# Patient Record
Sex: Male | Born: 1970 | Race: White | Hispanic: No | Marital: Married | Smoking: Never smoker
Health system: Southern US, Community
[De-identification: ages and names within clinical notes are randomized; demographics above are authoritative.]

## PROBLEM LIST (undated history)

## (undated) DIAGNOSIS — G4710 Hypersomnia, unspecified: Secondary | ICD-10-CM

## (undated) DIAGNOSIS — I10 Essential (primary) hypertension: Secondary | ICD-10-CM

## (undated) DIAGNOSIS — G4733 Obstructive sleep apnea (adult) (pediatric): Secondary | ICD-10-CM

## (undated) HISTORY — PX: TONSILLECTOMY: SUR1361

## (undated) HISTORY — DX: Obstructive sleep apnea (adult) (pediatric): G47.33

## (undated) HISTORY — DX: Hypersomnia, unspecified: G47.10

## (undated) HISTORY — PX: KNEE SURGERY: SHX244

## (undated) HISTORY — PX: VASECTOMY: SHX75

## (undated) HISTORY — PX: NASAL SEPTUM SURGERY: SHX37

---

## 2000-07-11 ENCOUNTER — Encounter: Payer: Self-pay | Admitting: Pulmonary Disease

## 2003-09-29 ENCOUNTER — Encounter: Payer: Self-pay | Admitting: Pulmonary Disease

## 2003-09-29 ENCOUNTER — Ambulatory Visit (HOSPITAL_BASED_OUTPATIENT_CLINIC_OR_DEPARTMENT_OTHER): Admission: RE | Admit: 2003-09-29 | Discharge: 2003-09-29 | Payer: Self-pay | Admitting: Pulmonary Disease

## 2003-09-30 ENCOUNTER — Encounter: Payer: Self-pay | Admitting: Pulmonary Disease

## 2004-09-16 ENCOUNTER — Ambulatory Visit: Payer: Self-pay | Admitting: Pulmonary Disease

## 2005-08-26 ENCOUNTER — Ambulatory Visit: Payer: Self-pay | Admitting: Pulmonary Disease

## 2005-11-01 ENCOUNTER — Ambulatory Visit: Payer: Self-pay | Admitting: Pulmonary Disease

## 2005-11-16 ENCOUNTER — Encounter: Payer: Self-pay | Admitting: Pulmonary Disease

## 2005-11-16 ENCOUNTER — Ambulatory Visit: Payer: Self-pay | Admitting: Internal Medicine

## 2006-04-20 ENCOUNTER — Ambulatory Visit: Payer: Self-pay | Admitting: Pulmonary Disease

## 2006-12-21 ENCOUNTER — Ambulatory Visit: Payer: Self-pay | Admitting: Pulmonary Disease

## 2007-11-27 ENCOUNTER — Emergency Department (HOSPITAL_COMMUNITY): Admission: EM | Admit: 2007-11-27 | Discharge: 2007-11-27 | Payer: Self-pay | Admitting: Family Medicine

## 2008-10-28 ENCOUNTER — Emergency Department (HOSPITAL_COMMUNITY): Admission: EM | Admit: 2008-10-28 | Discharge: 2008-10-28 | Payer: Self-pay | Admitting: Emergency Medicine

## 2010-02-17 DIAGNOSIS — G4733 Obstructive sleep apnea (adult) (pediatric): Secondary | ICD-10-CM | POA: Insufficient documentation

## 2010-02-18 ENCOUNTER — Ambulatory Visit: Payer: Self-pay | Admitting: Pulmonary Disease

## 2010-02-18 DIAGNOSIS — J309 Allergic rhinitis, unspecified: Secondary | ICD-10-CM | POA: Insufficient documentation

## 2010-02-18 DIAGNOSIS — G4710 Hypersomnia, unspecified: Secondary | ICD-10-CM

## 2010-02-19 ENCOUNTER — Telehealth (INDEPENDENT_AMBULATORY_CARE_PROVIDER_SITE_OTHER): Payer: Self-pay | Admitting: *Deleted

## 2010-03-09 ENCOUNTER — Encounter: Payer: Self-pay | Admitting: Pulmonary Disease

## 2010-03-17 ENCOUNTER — Emergency Department (HOSPITAL_COMMUNITY): Admission: EM | Admit: 2010-03-17 | Discharge: 2010-03-17 | Payer: Self-pay | Admitting: Family Medicine

## 2010-03-18 ENCOUNTER — Ambulatory Visit: Payer: Self-pay | Admitting: Pulmonary Disease

## 2010-11-02 NOTE — Progress Notes (Signed)
Summary: Physician's handwritten notes/[facility]  Physician's handwritten notes/[facility]   Imported By: Sherian Rein 02/19/2010 07:46:33  _____________________________________________________________________  External Attachment:    Type:   Image     Comment:   External Document

## 2010-11-02 NOTE — Assessment & Plan Note (Signed)
Summary: acute sick visit for osa/hypersomnia   Copy to:  Self Referral Primary Provider/Referring Provider:  Deboraha Sprang at [facility]  CC:  Sleep consult.  Former pt.  Last seen March 2008.Marland Kitchen  History of Present Illness: the pt is a 40y/o male who comes in today for f/u of his known osa and daytime sleepiness.  He has known mild osa/UARS, and MSLT in the past was negative for narcolepsy with no SOREMS and MSL .  He has been treated successfully in the past with cpap, and qam provigil.  He has not been seen since 2008, and comes in with significant sleepiness, but he has not been using cpap or taking his stimulant medication.  He has been falling asleep at work, and has severe daytime sleepiness.  His weight is fairly neutral since the last visit.  Current Medications (verified): 1)  Provigil 100 Mg Tabs (Modafinil) .... Use As Needed  Allergies (verified): 1)  ! Codeine  Past History:  Past Medical History:  ALLERGIC RHINITIS (ICD-477.9) OBSTRUCTIVE SLEEP APNEA (ICD-327.23)--AHI 5/hr with [facility] noted 2004 Hypersomnia--mslt 2004 with no SOREMS, MSL .    Past Surgical History: deviated septum tonsillectomy as a child vasectomy L knee surgery   Family History: Reviewed history from 02/17/2010 and no changes required. heart disease: maternal grandfather (m.i.)  Social History: Reviewed history from 02/17/2010 and no changes required. Patient never smoked.  pt is a Garment/textile technologist. pt is married. pt has children.  Review of Systems       The patient complains of nasal congestion/difficulty breathing through nose.  The patient denies shortness of breath with activity, shortness of breath at rest, productive cough, non-productive cough, coughing up blood, chest pain, irregular heartbeats, acid heartburn, indigestion, loss of appetite, weight change, abdominal pain, difficulty swallowing, sore throat, tooth/dental problems, headaches, sneezing, itching, ear ache, anxiety,  depression, hand/feet swelling, joint stiffness or pain, rash, change in color of mucus, and fever.    Vital Signs:  Patient profile:   40 year old male Height:      66 inches Weight:      177 pounds BMI:     28.67 O2 Sat:      98 % on Room air Temp:     98.0 degrees F oral Pulse rate:   84 / minute BP sitting:   128 / 86  (left arm) Cuff size:   regular  Vitals Entered By: Arman Filter LPN (Feb 18, 2010 3:03 PM)  O2 Flow:  Room air CC: Sleep consult.  Former pt.  Last seen March 2008. Comments Medications reviewed with patient Arman Filter LPN  Feb 18, 2010 3:03 PM    Physical Exam  General:  thin male in nad Neurologic:  appears very sleepy, but appropriate,moves all 4.   Impression & Recommendations:  Problem # 1:  OBSTRUCTIVE SLEEP APNEA (ICD-327.23) the pt has very mild osa by prior sleep study, and also clear cut upper airway resistance.  He has benefitted from cpap in the past, and should get back on this treatment.  I have also discussed with him the possibility of upper airway surgery and dental appliance.  Problem # 2:  HYPERSOMNIA (ICD-780.54) the pt has had persistent daytime hypersomnia despite cpap treatment in the past.  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  He has responded well to the addtion of stimulant medication in the form of provigil, and will restart him on nuvigil at this time.  Time spent  with pt today was .  Medications Added to Medication List This Visit: 1)  Provigil 100 Mg Tabs (Modafinil) .... Use as needed 2)  Nuvigil 150 Mg Tabs (Armodafinil) .... One each am 3)  Nuvigil 150 Mg Tabs (Armodafinil) .... One each am  Other Orders: Est. Patient Level IV (16109) DME Referral (DME)  Patient Instructions: 1)  will start back on cpap at same pressure level 2)  get back on nuvigil 150mg  one each day. 3)  followup with me in 4 weeks.  Prescriptions: NUVIGIL 150 MG TABS (ARMODAFINIL) one each am  #30 x  5   Entered and Authorized by:   Barbaraann Share MD   Signed by:   Barbaraann Share MD on 02/18/2010   Method used:   Print then Give to Patient   RxID:   [id number] NUVIGIL 150 MG TABS (ARMODAFINIL) one each am  #30 x 0   Entered and Authorized by:   Barbaraann Share MD   Signed by:   Barbaraann Share MD on 02/18/2010   Method used:   Print then Give to Patient   RxID:   [id number]

## 2010-11-02 NOTE — Consult Note (Signed)
Summary: Sleep Consult/[facility]  Sleep Consult/[facility]   Imported By: Sherian Rein 02/19/2010 07:44:50  _____________________________________________________________________  External Attachment:    Type:   Image     Comment:   External Document

## 2010-11-02 NOTE — Medication Information (Signed)
Summary: Tax adviser   Imported By: Lehman Prom 03/09/2010 09:12:35  _____________________________________________________________________  External Attachment:    Type:   Image     Comment:   External Document

## 2010-11-02 NOTE — Progress Notes (Signed)
Summary: Jose Singh  Phone Note Outgoing Call   Call placed by: Gweneth Dimitri RN,  Feb 19, 2010 3:38 PM Summary of Call: Received Prior Auth for Nuvigil 150mg .  Called number provided ([employer]) to initiate PA.  Was told to call [employer] at [phone] to initiate it.  Awaiting form to be faxed to triage.   Initial call taken by: Gweneth Dimitri RN,  Feb 19, 2010 3:39 PM  Follow-up for Phone Call        Form received and placed in [facility] very important folder to be addressed.  Gweneth Dimitri RN  Feb 19, 2010 3:43 PM  Form was faxed back to [facility] and placed in traige awaiting approval/denial Jose Singh  Feb 23, 2010 3:05 PM   Additional Follow-up for Phone Call Additional follow up Details #1::        received results from [facility].  Pt's Jose Singh has been approved effective 02-23-2010 to 11-18-2012.  Reference # is [id number].    Jose Millet Reynolds LPN  March 04, 2955 8:54 AM

## 2010-11-02 NOTE — Progress Notes (Signed)
Summary: Physician's handwritten notes/[facility]  Physician's handwritten notes/[facility]   Imported By: Sherian Rein 02/19/2010 07:48:19  _____________________________________________________________________  External Attachment:    Type:   Image     Comment:   External Document

## 2010-11-02 NOTE — Assessment & Plan Note (Signed)
Summary: rov for osa, EDS   Copy to:  Self Referral Primary Provider/Referring Provider:  Deboraha Sprang at [facility]  CC:  Pt is here for a 4 week f/u appt.  Pt states he is wearing his cpap machine "almost every night" Approx 5 nights per week and 7 hours per night.  Pt denied any complaints with mask or pressure.  Pt states Nuvigil is helping with "sx."  pt denied any new complaints. .  History of Present Illness: the pt comes in today for f/u of his mild osa with severe daytime sleepiness.  He was restarted on cpap the last visit, and also nuvigil during the day in order to maintain alertness at work.  He is doing well with cpap, and reports no problems with mask fit or pressure.  He wears during the week, but takes "holidays" on w/e since his degree of sleep apnea is not a health risk for him.  The nuvigil is working well for him while at work, and again, he typically takes drug holiday on weekends.  He is also working on behavioral changes to help with alertness during day.  Current Medications (verified): 1)  Nuvigil 150 Mg Tabs (Armodafinil) .... One Each Am 2)  Aspirin Ec 81 Mg Tbec (Aspirin) .... Take 1 Tablet By Mouth Once A Day 3)  Multivitamins  Tabs (Multiple Vitamin) .... Take 1 Tablet By Mouth Once A Day 4)  Fish Oil Tabs  Allergies (verified): 1)  ! Codeine  Review of Systems  The patient denies shortness of breath with activity, shortness of breath at rest, productive cough, non-productive cough, coughing up blood, chest pain, irregular heartbeats, acid heartburn, indigestion, loss of appetite, weight change, abdominal pain, difficulty swallowing, sore throat, tooth/dental problems, headaches, nasal congestion/difficulty breathing through nose, sneezing, itching, ear ache, anxiety, depression, hand/feet swelling, joint stiffness or pain, rash, change in color of mucus, and fever.    Vital Signs:  Patient profile:   40 year old male Height:      66 inches Weight:      179  pounds O2 Sat:      96 % on Room air Temp:     98.0 degrees F oral Pulse rate:   61 / minute BP sitting:   112 / 70  (left arm) Cuff size:   regular  Vitals Entered By: Arman Filter LPN (March 18, 2010 3:54 PM)  O2 Flow:  Room air CC: Pt is here for a 4 week f/u appt.  Pt states he is wearing his cpap machine "almost every night" Approx 5 nights per week and 7 hours per night.  Pt denied any complaints with mask or pressure.  Pt states Nuvigil is helping with "sx."  pt denied any new complaints.  Comments Medications reviewed with patient Arman Filter LPN  March 18, 2010 3:58 PM    Physical Exam  General:  wd male in nad Nose:  no skin breakdown or pressure necrosis from cpap mask Extremities:  no edema or cyanosis Neurologic:  alert, not sleepy, moves all 4.   Impression & Recommendations:  Problem # 1:  OBSTRUCTIVE SLEEP APNEA (ICD-327.23)  the pt is wearing cpap compliantly during the work week with success, and takes nuvigil during day to help with aletness while at work.  He is having no issues with the cpap device.  He is to continue with the current treatment regimen, and I have also asked him to continue with his behavioral changes.  I have also asked  him to consider again a dental appliance, since I suspect his anatomy would be amenable to this treatment.  He will f/u with me in 12mos or sooner if having issues.  Medications Added to Medication List This Visit: 1)  Aspirin Ec 81 Mg Tbec (Aspirin) .... Take 1 tablet by mouth once a day 2)  Multivitamins Tabs (Multiple vitamin) .... Take 1 tablet by mouth once a day 3)  Fish Oil Tabs   Other Orders: Est. Patient Level III (16109)  Patient Instructions: 1)  stay on cpap as much as possible 2)  use nuvigil as directed to maintain alertness while at work 3)  continue behavioral therapies as you are doing. 4)  followup with me in one year.

## 2011-01-17 LAB — POCT RAPID STREP A (OFFICE): Streptococcus, Group A Screen (Direct): POSITIVE — AB

## 2011-04-04 ENCOUNTER — Encounter: Payer: Self-pay | Admitting: Pulmonary Disease

## 2011-04-05 ENCOUNTER — Ambulatory Visit: Payer: Self-pay | Admitting: Pulmonary Disease

## 2011-05-27 ENCOUNTER — Encounter: Payer: Self-pay | Admitting: Pulmonary Disease

## 2011-05-27 ENCOUNTER — Ambulatory Visit (INDEPENDENT_AMBULATORY_CARE_PROVIDER_SITE_OTHER): Payer: BC Managed Care – PPO | Admitting: Pulmonary Disease

## 2011-05-27 DIAGNOSIS — G4733 Obstructive sleep apnea (adult) (pediatric): Secondary | ICD-10-CM

## 2011-05-27 DIAGNOSIS — G4710 Hypersomnia, unspecified: Secondary | ICD-10-CM

## 2011-05-27 MED ORDER — ARMODAFINIL 150 MG PO TABS: 150.0000 mg | ORAL_TABLET | Freq: Every day | ORAL | Status: DC

## 2011-05-27 MED ORDER — BENZONATATE 100 MG PO CAPS: 200.0000 mg | ORAL_CAPSULE | Freq: Four times a day (QID) | ORAL | Status: DC | PRN

## 2011-05-27 NOTE — Patient Instructions (Signed)
Continue on cpap as much as you can, but consider a dental appliance.  Continue with good sleep hygiene Take nuvigil as needed for situations where absolute alertness is required. followup with me in one year.

## 2011-05-27 NOTE — Progress Notes (Signed)
  Subjective:    Patient ID: Jose Singh, male    DOB: July 30, 1971, 40 y.o.   MRN: [id number]  HPI The patient comes in today for follow up of his known sleep apnea, as well as his daytime hypersomnia of unknown origin.  Patient states that he is wearing CPAP prospect for 7 nights on a consistent basis, and it does help him whenever he uses.  He denies any issues with pressure or mask fit.  The patient is getting to bed at a reasonable hour, and feels that his total sleep time is satisfactory.  He will take a nap during the day if needed.  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   Review of Systems  Constitutional: Negative for fever and unexpected weight change.  HENT: Positive for congestion. Negative for ear pain, nosebleeds, sore throat, rhinorrhea, sneezing, trouble swallowing, dental problem, postnasal drip and sinus pressure.   Eyes: Negative for redness and itching.  Respiratory: Negative for cough, chest tightness, shortness of breath and wheezing.   Cardiovascular: Negative for palpitations and leg swelling.  Gastrointestinal: Negative for nausea and vomiting.  Genitourinary: Negative for dysuria.  Musculoskeletal: Negative for joint swelling.  Skin: Negative for rash.  Neurological: Negative for headaches.  Hematological: Does not bruise/bleed easily.  Psychiatric/Behavioral: Negative for dysphoric mood. The patient is not nervous/anxious.        Objective:   Physical Exam Well-developed male in no acute distress No skin breakdown or pressure necrosis from CPAP mask Lower extremities without edema, no cyanosis noted Alert, minimally sleepy on observation, moves all 4 extremities.       Assessment & Plan:

## 2011-05-27 NOTE — Assessment & Plan Note (Signed)
The patient is doing fairly well with CPAP, although he does not wear it every single night.  He does see benefit from the treatment.  He is hesitant to try a dental appliance, which I think he would respond to very nicely.  He has a lot of nasal congestion issues, it is actually going to see ENT about possible surgery.  If he continues to have snoring after nasal surgery, he may be willing to try a dental appliance.  I've also encouraged him to work on modest weight loss.

## 2011-05-27 NOTE — Assessment & Plan Note (Signed)
The patient is taking nuvigil as needed whenever he needs to maintain strict alertness related to his job or driving.  He is sticking to a good sleep hygiene regimen.

## 2011-12-06 ENCOUNTER — Other Ambulatory Visit: Payer: Self-pay | Admitting: Pulmonary Disease

## 2011-12-06 MED ORDER — ARMODAFINIL 150 MG PO TABS: 150.0000 mg | ORAL_TABLET | Freq: Every day | ORAL | Status: DC

## 2011-12-15 ENCOUNTER — Telehealth: Payer: Self-pay | Admitting: Pulmonary Disease

## 2011-12-15 NOTE — Telephone Encounter (Signed)
According to epic this was sent in on 12/06/11 #30 x 5 refills but [facility] never received it. I gave verbal order for this and pt is aware. Nothing further was needed

## 2012-05-30 ENCOUNTER — Ambulatory Visit: Payer: BC Managed Care – PPO | Admitting: Pulmonary Disease

## 2012-06-12 ENCOUNTER — Encounter: Payer: Self-pay | Admitting: Pulmonary Disease

## 2012-06-12 ENCOUNTER — Ambulatory Visit (INDEPENDENT_AMBULATORY_CARE_PROVIDER_SITE_OTHER): Payer: BC Managed Care – PPO | Admitting: Pulmonary Disease

## 2012-06-12 VITALS — BP 120/78 | HR 71 | Temp 98.5°F | Ht 66.0 in | Wt 179.8 lb

## 2012-06-12 DIAGNOSIS — G4733 Obstructive sleep apnea (adult) (pediatric): Secondary | ICD-10-CM

## 2012-06-12 NOTE — Patient Instructions (Addendum)
Continue to get adequate quantity of sleep Keep trying cpap as much as you can.  Will refer to sleep center for mask fitting if you would like. Think about dental appliance.  Can refer you to Dr. Althea Grimmer. Work on [employer] loss Continue nuvigil as needed to maintain daytime alertness.  followup with me in one year.

## 2012-06-12 NOTE — Progress Notes (Deleted)
  Subjective:    Patient ID: Jose Singh, male    DOB: Dec 15, 1970, 41 y.o.   MRN: [id number]  HPI    Review of Systems     Objective:   Physical Exam        Assessment & Plan:

## 2012-06-12 NOTE — Progress Notes (Signed)
  Subjective:    Patient ID: Jose Singh, male    DOB: 1971-08-27, 41 y.o.   MRN: [id number]  HPI Patient comes in today for followup of his mild obstructive sleep apnea, along with significant daytime hypersomnia.  He has been wearing CPAP intermittently, and continues to have issues with mask fit.  When he does wear the CPAP, he sees improvement in his sleep and daytime alertness.  He is maintaining good sleep hygiene, and is only using his stimulant medication on high demand days.   Review of Systems  Constitutional: Negative for fever and unexpected weight change.  HENT: Negative for ear pain, nosebleeds, congestion, sore throat, rhinorrhea, sneezing, trouble swallowing, dental problem, postnasal drip and sinus pressure.   Eyes: Negative for redness and itching.  Respiratory: Negative for cough, chest tightness, shortness of breath and wheezing.   Cardiovascular: Negative for palpitations and leg swelling.  Gastrointestinal: Negative for nausea and vomiting.  Genitourinary: Negative for dysuria.  Musculoskeletal: Negative for joint swelling.  Skin: Negative for rash.  Neurological: Negative for headaches.  Hematological: Does not bruise/bleed easily.  Psychiatric/Behavioral: Negative for dysphoric mood. The patient is not nervous/anxious.        Objective:   Physical Exam Overweight male in no acute distress Nose without purulence or discharge noted No skin breakdown or pressure necrosis from the CPAP mask Lower extremities without edema, no cyanosis Alert and oriented, moves all 4 extremities.       Assessment & Plan:

## 2012-06-12 NOTE — Assessment & Plan Note (Signed)
The patient continues to have issues with sleep disordered breathing, and is trying to wear CPAP as much as possible.  He is not been able to find a mask that fits him properly, and therefore his compliance is spotty.  He is trying to work on weight loss as well.  He is trying to get an adequate quantity of sleep, and is using nuvigil only on high demand days.  I have also discussed with him today the possibility of a dental appliance, and he is willing to see dental medicine about this.

## 2013-01-30 ENCOUNTER — Other Ambulatory Visit: Payer: Self-pay | Admitting: Pulmonary Disease

## 2013-02-05 ENCOUNTER — Telehealth: Payer: Self-pay | Admitting: Pulmonary Disease

## 2013-02-05 NOTE — Telephone Encounter (Signed)
[facility]. Patient is requesting refill on Nuvigil. Last filled 12/06/11 #30 5 refills Dr. Shelle Iron can this be filled again? PLease advise, thank you  Last OV: 06/12/12 w 1 yr follow up

## 2013-02-06 MED ORDER — ARMODAFINIL 150 MG PO TABS: 150.0000 mg | ORAL_TABLET | Freq: Every day | ORAL | Status: DC

## 2013-02-06 NOTE — Telephone Encounter (Signed)
yes

## 2013-02-06 NOTE — Telephone Encounter (Signed)
i called [facility] gave VO to refill. Nothing further was needed

## 2013-06-12 ENCOUNTER — Ambulatory Visit: Payer: BC Managed Care – PPO | Admitting: Pulmonary Disease

## 2013-07-03 ENCOUNTER — Ambulatory Visit: Payer: BC Managed Care – PPO | Admitting: Pulmonary Disease

## 2013-08-14 ENCOUNTER — Encounter: Payer: Self-pay | Admitting: Pulmonary Disease

## 2013-08-14 ENCOUNTER — Ambulatory Visit (INDEPENDENT_AMBULATORY_CARE_PROVIDER_SITE_OTHER): Payer: BC Managed Care – PPO | Admitting: Pulmonary Disease

## 2013-08-14 VITALS — BP 122/88 | HR 50 | Temp 97.9°F | Ht 66.0 in | Wt 170.0 lb

## 2013-08-14 DIAGNOSIS — G4710 Hypersomnia, unspecified: Secondary | ICD-10-CM

## 2013-08-14 DIAGNOSIS — G4733 Obstructive sleep apnea (adult) (pediatric): Secondary | ICD-10-CM

## 2013-08-14 NOTE — Assessment & Plan Note (Signed)
The patient is seen significant improvement in his daytime alertness, and has been using his stimulant medication less and less.  I suspect if he is able to lose to his optimal weight, he will not need his CPAP or the medication.

## 2013-08-14 NOTE — Progress Notes (Signed)
  Subjective:    Patient ID: Jose Singh, male    DOB: 1971/02/15, 42 y.o.   MRN: [id number]  HPI Patient comes in today for followup of his known sleep apnea with daytime sleepiness.  He has been wearing CPAP compliant, but is also lost significant weight by running marathons.  He feels that he is sleeping much better, and is using his stimulant medications  less and less.   Review of Systems  Constitutional: Negative for fever and unexpected weight change.  HENT: Negative for congestion, dental problem, ear pain, nosebleeds, postnasal drip, rhinorrhea, sinus pressure, sneezing, sore throat and trouble swallowing.   Eyes: Negative for redness and itching.  Respiratory: Negative for cough, chest tightness, shortness of breath and wheezing.   Cardiovascular: Negative for palpitations and leg swelling.  Gastrointestinal: Negative for nausea and vomiting.  Genitourinary: Negative for dysuria.  Musculoskeletal: Negative for joint swelling.  Skin: Negative for rash.  Neurological: Negative for headaches.  Hematological: Does not bruise/bleed easily.  Psychiatric/Behavioral: Negative for dysphoric mood. The patient is not nervous/anxious.        Objective:   Physical Exam Thin male in no acute distress Nose without purulence or discharge noted Neck without lymphadenopathy or thyromegaly Lower extremities without edema, no cyanosis Alert, does not appear to be sleepy, moves all 4 extremities.       Assessment & Plan:

## 2013-08-14 NOTE — Patient Instructions (Signed)
Keep working toward your weight goal.  Once there, we can do a home sleep test to see if you still have sleep apnea. Use your stimulant medication sparingly as you are doing. followup with me in one year if doing well.

## 2013-08-14 NOTE — Assessment & Plan Note (Signed)
The pt is doing well with cpap, but is making strides in weight loss.  If he loses further, may be able to come off cpap.   He will let me know.

## 2014-08-15 ENCOUNTER — Ambulatory Visit: Payer: BC Managed Care – PPO | Admitting: Pulmonary Disease

## 2015-01-21 ENCOUNTER — Ambulatory Visit (INDEPENDENT_AMBULATORY_CARE_PROVIDER_SITE_OTHER): Payer: BLUE CROSS/BLUE SHIELD | Admitting: Pulmonary Disease

## 2015-01-21 ENCOUNTER — Encounter: Payer: Self-pay | Admitting: Pulmonary Disease

## 2015-01-21 VITALS — BP 132/78 | HR 52 | Temp 98.1°F | Ht 66.0 in | Wt 184.6 lb

## 2015-01-21 DIAGNOSIS — G4733 Obstructive sleep apnea (adult) (pediatric): Secondary | ICD-10-CM

## 2015-01-21 DIAGNOSIS — G4710 Hypersomnia, unspecified: Secondary | ICD-10-CM | POA: Diagnosis not present

## 2015-01-21 MED ORDER — ARMODAFINIL 150 MG PO TABS: 150.0000 mg | ORAL_TABLET | Freq: Every day | ORAL | Status: DC | PRN

## 2015-01-21 NOTE — Assessment & Plan Note (Signed)
The patient continues to wear his C Pap at least 4-5 nights a week, and feels that he does well with the device. He has gained some weight since the last visit, and he is working on this. I have asked him to stay on his C Pap, and to keep up with his mask changes and supplies.

## 2015-01-21 NOTE — Assessment & Plan Note (Signed)
The patient has a history of hypersomnia that I suspect is related to his sleep apnea despite good compliance with C Pap. He has been treated with stimulant medicine that he primarily uses for long drives and as needed. He has seen a significant decline in his need for the stimulant medication since he has lost weight.

## 2015-01-21 NOTE — Progress Notes (Signed)
   Subjective:    Patient ID: Jose Singh, male    DOB: 07-Jul-1971, 44 y.o.   MRN: [id number]  HPI The patient comes in today for follow-up of his known obstructive sleep apnea, with persistent daytime hypersomnia for which she takes when necessary stimulant medication. He is doing well with his C Pap device, and feels that it continues to help his sleep. He remains inconvenient, but he has decided against a dental appliance because of expense. He rarely takes his stimulant medication any more since he has lost weight, but primarily reserves it for long drives and also long days at work.   Review of Systems  Constitutional: Negative for fever and unexpected weight change.  HENT: Negative for congestion, dental problem, ear pain, nosebleeds, postnasal drip, rhinorrhea, sinus pressure, sneezing, sore throat and trouble swallowing.   Eyes: Negative for redness and itching.  Respiratory: Negative for cough, chest tightness, shortness of breath and wheezing.   Cardiovascular: Negative for palpitations and leg swelling.  Gastrointestinal: Negative for nausea and vomiting.  Genitourinary: Negative for dysuria.  Musculoskeletal: Negative for joint swelling.  Skin: Negative for rash.  Neurological: Negative for headaches.  Hematological: Does not bruise/bleed easily.  Psychiatric/Behavioral: Negative for dysphoric mood. The patient is not nervous/anxious.        Objective:   Physical Exam Well-developed male in no acute distress Nose without purulence or discharge noted No skin breakdown or pressure necrosis from the C Pap mask Lower extremities without edema, no cyanosis Alert and oriented, moves all 4 extremities.       Assessment & Plan:

## 2015-01-21 NOTE — Patient Instructions (Signed)
Continue on cpap, and work on modest weight loss Limit your stimulant medication as much as you can. followup with Dr. Halford Chessman in one year.

## 2016-06-15 ENCOUNTER — Ambulatory Visit: Payer: BLUE CROSS/BLUE SHIELD | Admitting: Pulmonary Disease

## 2017-01-18 DIAGNOSIS — I10 Essential (primary) hypertension: Secondary | ICD-10-CM | POA: Diagnosis not present

## 2017-01-18 DIAGNOSIS — Z0000 Encounter for general adult medical examination without abnormal findings: Secondary | ICD-10-CM | POA: Diagnosis not present

## 2017-01-25 DIAGNOSIS — I10 Essential (primary) hypertension: Secondary | ICD-10-CM | POA: Diagnosis not present

## 2017-01-25 DIAGNOSIS — I440 Atrioventricular block, first degree: Secondary | ICD-10-CM | POA: Diagnosis not present

## 2017-01-25 DIAGNOSIS — Z0000 Encounter for general adult medical examination without abnormal findings: Secondary | ICD-10-CM | POA: Diagnosis not present

## 2017-01-25 DIAGNOSIS — Z1389 Encounter for screening for other disorder: Secondary | ICD-10-CM | POA: Diagnosis not present

## 2018-02-16 DIAGNOSIS — R82998 Other abnormal findings in urine: Secondary | ICD-10-CM | POA: Diagnosis not present

## 2018-02-16 DIAGNOSIS — Z0000 Encounter for general adult medical examination without abnormal findings: Secondary | ICD-10-CM | POA: Diagnosis not present

## 2018-03-07 DIAGNOSIS — Z0000 Encounter for general adult medical examination without abnormal findings: Secondary | ICD-10-CM | POA: Diagnosis not present

## 2018-03-07 DIAGNOSIS — Z23 Encounter for immunization: Secondary | ICD-10-CM | POA: Diagnosis not present

## 2018-03-07 DIAGNOSIS — Z1389 Encounter for screening for other disorder: Secondary | ICD-10-CM | POA: Diagnosis not present

## 2018-03-07 DIAGNOSIS — I440 Atrioventricular block, first degree: Secondary | ICD-10-CM | POA: Diagnosis not present

## 2018-03-07 DIAGNOSIS — L237 Allergic contact dermatitis due to plants, except food: Secondary | ICD-10-CM | POA: Diagnosis not present

## 2018-03-15 DIAGNOSIS — Z1212 Encounter for screening for malignant neoplasm of rectum: Secondary | ICD-10-CM | POA: Diagnosis not present

## 2018-07-17 DIAGNOSIS — Z6834 Body mass index (BMI) 34.0-34.9, adult: Secondary | ICD-10-CM | POA: Diagnosis not present

## 2018-07-17 DIAGNOSIS — R05 Cough: Secondary | ICD-10-CM | POA: Diagnosis not present

## 2018-07-17 DIAGNOSIS — J209 Acute bronchitis, unspecified: Secondary | ICD-10-CM | POA: Diagnosis not present

## 2018-07-24 DIAGNOSIS — R05 Cough: Secondary | ICD-10-CM | POA: Diagnosis not present

## 2018-07-24 DIAGNOSIS — J209 Acute bronchitis, unspecified: Secondary | ICD-10-CM | POA: Diagnosis not present

## 2018-07-24 DIAGNOSIS — Z6828 Body mass index (BMI) 28.0-28.9, adult: Secondary | ICD-10-CM | POA: Diagnosis not present

## 2018-08-07 DIAGNOSIS — H40003 Preglaucoma, unspecified, bilateral: Secondary | ICD-10-CM | POA: Diagnosis not present

## 2020-08-18 ENCOUNTER — Ambulatory Visit
Admission: EM | Admit: 2020-08-18 | Discharge: 2020-08-18 | Disposition: A | Discharge status: Home / Self Care | Payer: 59 | Attending: Family Medicine | Admitting: Family Medicine

## 2020-08-18 DIAGNOSIS — J029 Acute pharyngitis, unspecified: Secondary | ICD-10-CM | POA: Diagnosis present

## 2020-08-18 HISTORY — DX: Essential (primary) hypertension: I10

## 2020-08-18 LAB — POCT RAPID STREP A (OFFICE): Rapid Strep A Screen: NEGATIVE

## 2020-08-18 MED ORDER — CEFDINIR 300 MG PO CAPS: 600.0000 mg | ORAL_CAPSULE | Freq: Every day | ORAL | 0 refills | Status: DC

## 2020-08-18 NOTE — ED Provider Notes (Signed)
[facility]    CSN: [id number] Arrival date & time: 08/18/20  1657      History   Chief Complaint Chief Complaint  Patient presents with  . Fever  . Sore Throat    HPI Jose Singh is a 49 y.o. male.   Initial EUC visit  This is a 49 year old man who developed sore throat and fever over the last 2 days following a Covid vaccination.  He has a history of strep.     Past Medical History:  Diagnosis Date  . Allergic rhinitis   . Hypersomnia    mslt 2004 w/ no SOREMS, MSL 17 min  . Hypertension   . OSA (obstructive sleep apnea)    AHI 5/hr w/ UAR noted 2004    Patient Active Problem List   Diagnosis Date Noted  . ALLERGIC RHINITIS 02/18/2010  . Hypersomnia 02/18/2010  . Obstructive sleep apnea 02/17/2010    Past Surgical History:  Procedure Laterality Date  . KNEE SURGERY     Left  . NASAL SEPTUM SURGERY    . TONSILLECTOMY     as a child  . VASECTOMY         Home Medications    Prior to Admission medications   Medication Sig Start Date End Date Taking? Authorizing Provider  lisdexamfetamine (VYVANSE) 30 MG capsule Take 30 mg by mouth daily.   Yes [provider]  lisinopril (ZESTRIL) 10 MG tablet Take 10 mg by mouth daily.   Yes [provider]  cefdinir (OMNICEF) 300 MG capsule Take 2 capsules (600 mg total) by mouth daily. 08/18/20   Robyn Haber, MD  Multiple Vitamin (MULTIVITAMIN) capsule Take 1 capsule by mouth daily.      [provider]    Family History Family History  Problem Relation Age of Onset  . Heart attack Maternal Grandfather     Social History Social History   Tobacco Use  . Smoking status: Never Smoker  . Smokeless tobacco: Never Used  Substance Use Topics  . Alcohol use: Yes  . Drug use: Not Currently     Allergies   Codeine   Review of Systems Review of Systems  Constitutional: Positive for chills and fever.  HENT: Positive for postnasal drip and sore  throat.   Respiratory: Negative.   Gastrointestinal: Negative.      Physical Exam Triage Vital Signs ED Triage Vitals  Enc Vitals Group     BP 08/18/20 1703 (!) 149/90     Pulse Rate 08/18/20 1703 73     Resp 08/18/20 1703 20     Temp 08/18/20 1703 (!) 100.7 F (38.2 C)     Temp Source 08/18/20 1703 Oral     SpO2 08/18/20 1703 95 %     Weight --      Height --      Head Circumference --      Peak Flow --      Pain Score 08/18/20 1710 4     Pain Loc --      Pain Edu? --      Excl. in [place]? --    No data found.  Updated Vital Signs BP (!) 149/90 (BP Location: Left Arm)   Pulse 73   Temp (!) 100.7 F (38.2 C) (Oral)   Resp 20   SpO2 95%    Physical Exam Vitals and nursing note reviewed.  Constitutional:      Appearance: He is well-developed and normal weight.  HENT:  Head: Normocephalic.     Mouth/Throat:     Mouth: Mucous membranes are moist.     Pharynx: Uvula midline. Posterior oropharyngeal erythema present.     Tonsils: No tonsillar exudate.  Pulmonary:     Effort: Pulmonary effort is normal.  Musculoskeletal:     Cervical back: Normal range of motion and neck supple.  Skin:    General: Skin is warm and dry.  Neurological:     General: No focal deficit present.     Mental Status: He is alert and oriented to person, place, and time.  Psychiatric:        Mood and Affect: Mood normal.        Behavior: Behavior normal.      UC Treatments / Results  Labs (all labs ordered are listed, but only abnormal results are displayed) Labs Reviewed  CULTURE, GROUP A STREP [facility])  POCT RAPID STREP A (OFFICE)    EKG   Radiology No results found.  Procedures Procedures (including critical care time)  Medications Ordered in UC Medications - No data to display  Initial Impression / Assessment and Plan / UC Course  I have reviewed the triage vital signs and the nursing notes.  Pertinent labs & imaging results that were available during my care of the  patient were reviewed by me and considered in my medical decision making (see chart for details).    Final Clinical Impressions(s) / UC Diagnoses   Final diagnoses:  Acute pharyngitis, unspecified etiology   Discharge Instructions   None    ED Prescriptions    Medication Sig Dispense Auth. Provider   cefdinir (OMNICEF) 300 MG capsule Take 2 capsules (600 mg total) by mouth daily. 20 capsule Robyn Haber, MD     I have reviewed the PDMP during this encounter.   Robyn Haber, MD 08/18/20 1725

## 2020-08-18 NOTE — ED Triage Notes (Signed)
Pt states had his covid booster on Saturday. Developed a fever on Sunday and a sore throat today. States hx of strep.

## 2020-08-22 LAB — CULTURE, GROUP A STREP (THRC)

## 2021-07-01 ENCOUNTER — Other Ambulatory Visit: Payer: Self-pay | Admitting: Internal Medicine

## 2021-07-01 DIAGNOSIS — E785 Hyperlipidemia, unspecified: Secondary | ICD-10-CM

## 2021-07-02 ENCOUNTER — Encounter: Payer: Self-pay | Admitting: Gastroenterology

## 2021-08-04 ENCOUNTER — Ambulatory Visit
Admission: RE | Admit: 2021-08-04 | Discharge: 2021-08-04 | Disposition: A | Discharge status: Home / Self Care | Payer: No Typology Code available for payment source | Source: Ambulatory Visit | Attending: Internal Medicine | Admitting: Internal Medicine

## 2021-08-04 DIAGNOSIS — E785 Hyperlipidemia, unspecified: Secondary | ICD-10-CM

## 2021-09-06 ENCOUNTER — Encounter: Payer: Self-pay | Admitting: Gastroenterology

## 2021-09-06 ENCOUNTER — Encounter: Payer: BLUE CROSS/BLUE SHIELD | Admitting: Gastroenterology

## 2021-10-25 ENCOUNTER — Telehealth: Payer: Self-pay | Admitting: *Deleted

## 2021-10-25 NOTE — Telephone Encounter (Signed)
ATTEMPTED TO CALL X2 UNABLE TO LEAVE VOICEMAIL BOX HAS NOT BEEN SET UP UNABLE TO LEAVE VOICEMAIL.WILL SEND NO SHOW LETTER AT END OF DAY IF PT. DOES NOT CALL BACK.

## 2021-10-25 NOTE — Telephone Encounter (Signed)
PT. Called back at 4:30 and was told that he needed to reschedule pre-visit,he cancelled pre-visit and procedure through the patient care coordinator.

## 2021-11-08 ENCOUNTER — Encounter: Payer: Self-pay | Admitting: Gastroenterology

## 2021-11-16 ENCOUNTER — Encounter: Payer: Self-pay | Admitting: Internal Medicine

## 2021-12-20 ENCOUNTER — Other Ambulatory Visit: Payer: Self-pay

## 2021-12-20 ENCOUNTER — Encounter: Payer: Self-pay | Admitting: Gastroenterology

## 2021-12-20 ENCOUNTER — Ambulatory Visit (AMBULATORY_SURGERY_CENTER): Payer: BC Managed Care – PPO | Admitting: *Deleted

## 2021-12-20 VITALS — Ht 66.0 in | Wt 178.0 lb

## 2021-12-20 DIAGNOSIS — Z1211 Encounter for screening for malignant neoplasm of colon: Secondary | ICD-10-CM

## 2021-12-20 MED ORDER — NA SULFATE-K SULFATE-MG SULF 17.5-3.13-1.6 GM/177ML PO SOLN: 1.0000 | Freq: Once | ORAL | 0 refills | Status: AC

## 2021-12-20 NOTE — Progress Notes (Signed)

## 2022-01-03 ENCOUNTER — Encounter: Payer: Self-pay | Admitting: Internal Medicine

## 2022-01-03 ENCOUNTER — Ambulatory Visit (AMBULATORY_SURGERY_CENTER): Payer: BC Managed Care – PPO | Admitting: Internal Medicine

## 2022-01-03 VITALS — BP 101/57 | HR 64 | Temp 97.1°F | Resp 13 | Ht 66.0 in | Wt 178.0 lb

## 2022-01-03 DIAGNOSIS — D123 Benign neoplasm of transverse colon: Secondary | ICD-10-CM

## 2022-01-03 DIAGNOSIS — Z1211 Encounter for screening for malignant neoplasm of colon: Secondary | ICD-10-CM

## 2022-01-03 DIAGNOSIS — K635 Polyp of colon: Secondary | ICD-10-CM

## 2022-01-03 DIAGNOSIS — D125 Benign neoplasm of sigmoid colon: Secondary | ICD-10-CM

## 2022-01-03 MED ORDER — SODIUM CHLORIDE 0.9 % IV SOLN: 500.0000 mL | Freq: Once | INTRAVENOUS | Status: DC

## 2022-01-03 NOTE — Progress Notes (Signed)
? ?GASTROENTEROLOGY PROCEDURE H&P NOTE  ? ?Primary Care Physician: ?Mitchell, L.Marlou Sa, MD ? ? ? ?Reason for Procedure:   Colon cancer screening ? ?Plan:    Colonoscop ? ?Patient is appropriate for endoscopic procedure(s) in the ambulatory ([place]) setting. ? ?The nature of the procedure, as well as the risks, benefits, and alternatives were carefully and thoroughly reviewed with the patient. Ample time for discussion and questions allowed. The patient understood, was satisfied, and agreed to proceed.  ? ? ? ?HPI: ?Jose Singh is a 51 y.o. male who presents for colonoscopy for colon cancer screening. Denies blood in stools, changes in bowel habits, weight loss. Denies fam hx of colon cancer. ? ?Past Medical History:  ?Diagnosis Date  ? Allergic rhinitis   ? Hypersomnia   ? mslt 2004 w/ no SOREMS, MSL 17 min  ? Hypertension   ? OSA (obstructive sleep apnea)   ? AHI 5/hr w/ UAR noted 2004  ? ? ?Past Surgical History:  ?Procedure Laterality Date  ? KNEE SURGERY    ? Left,as a child  ? NASAL SEPTUM SURGERY    ? TONSILLECTOMY    ? as a child  ? VASECTOMY    ? ? ?Prior to Admission medications   ?Medication Sig Start Date End Date Taking? Authorizing Provider  ?lisinopril (ZESTRIL) 10 MG tablet Take 10 mg by mouth daily.   Yes [provider]  ?methylphenidate 36 MG PO CR tablet 1 tablet in the morning 11/02/21  Yes [provider]  ?Multiple Vitamin (MULTIVITAMIN) capsule Take 1 capsule by mouth daily.     Yes [provider]  ?Omega 3 340 MG CPDR Take one tablet by mouth once 12/23/15  Yes [provider]  ? ? ?Current Outpatient Medications  ?Medication Sig Dispense Refill  ? lisinopril (ZESTRIL) 10 MG tablet Take 10 mg by mouth daily.    ? methylphenidate 36 MG PO CR tablet 1 tablet in the morning    ? Multiple Vitamin (MULTIVITAMIN) capsule Take 1 capsule by mouth daily.      ? Omega 3 340 MG CPDR Take one tablet by mouth once    ? ?Current Facility-Administered Medications   ?Medication Dose Route Frequency Provider Last Rate Last Admin  ? 0.9 %  sodium chloride infusion  500 mL Intravenous Once Sharyn Creamer, MD      ? ? ?Allergies as of 01/03/2022 - Review Complete 01/03/2022  ?Allergen Reaction Noted  ? Cat hair extract Other (See Comments) 08/08/2021  ? Codeine    ? ? ?Family History  ?Problem Relation Age of Onset  ? Heart attack Maternal Grandfather   ? Colon cancer Neg Hx   ? Colon polyps Neg Hx   ? Esophageal cancer Neg Hx   ? Rectal cancer Neg Hx   ? Stomach cancer Neg Hx   ? ? ?Social History  ? ?Socioeconomic History  ? Marital status: Married  ?  Spouse name: Not on file  ? Number of children: Y  ? Years of education: Not on file  ? Highest education level: Not on file  ?Occupational History  ? Occupation: nurse anesthetist  ?Tobacco Use  ? Smoking status: Never  ?  Passive exposure: Never  ? Smokeless tobacco: Never  ?Vaping Use  ? Vaping Use: Never used  ?Substance and Sexual Activity  ? Alcohol use: Yes  ?  Comment: 3 x a week  ? Drug use: Not Currently  ? Sexual activity: Not on file  ?Other  Topics Concern  ? Not on file  ?Social History Narrative  ? Not on file  ? ?Social Determinants of Health  ? ?Financial Resource Strain: Not on file  ?Food Insecurity: Not on file  ?Transportation Needs: Not on file  ?Physical Activity: Not on file  ?Stress: Not on file  ?Social Connections: Not on file  ?Intimate Partner Violence: Not on file  ? ? ?Physical Exam: ?Vital signs in last 24 hours: ?BP 119/61   Pulse 64   Temp (!) 97.1 ?F (36.2 ?C)   Ht '5\' 6"'$  (1.676 m)   Wt 178 lb (80.7 kg)   SpO2 100%   BMI 28.73 kg/m?  ?GEN: NAD ?EYE: Sclerae anicteric ?ENT: MMM ?CV: Non-tachycardic ?Pulm: No increased work of breathing ?GI: Soft, NT/ND ?NEURO:  Alert & Oriented ? ? ?Christia Reading, MD ?[facility] ? ?01/03/2022 8:33 AM ? ?

## 2022-01-03 NOTE — Progress Notes (Signed)
To pacu, VSS. Report to Rn.tb 

## 2022-01-03 NOTE — Progress Notes (Signed)
Pt reported he was a runner.  Heart rate in 50 's which is normal for him.   ? ?No problems noted in the recovery room. maw  ?

## 2022-01-03 NOTE — Patient Instructions (Addendum)
Handouts were given to your care partner on polyps, diverticulosis, and hemorrhoids. ?You may resume your current medications today. ?Await biopsy results.  May take 1-3 weeks to receive pathology results. ?Please call if any questions or concerns. ?  ? ? ? ?YOU HAD AN ENDOSCOPIC PROCEDURE TODAY AT [facility]:   Refer to the procedure report that was given to you for any specific questions about what was found during the examination.  If the procedure report does not answer your questions, please call your gastroenterologist to clarify.  If you requested that your care partner not be given the details of your procedure findings, then the procedure report has been included in a sealed envelope for you to review at your convenience later. ? ?YOU SHOULD EXPECT: Some feelings of bloating in the abdomen. Passage of more gas than usual.  Walking can help get rid of the air that was put into your GI tract during the procedure and reduce the bloating. If you had a lower endoscopy (such as a colonoscopy or flexible sigmoidoscopy) you may notice spotting of blood in your stool or on the toilet paper. If you underwent a bowel prep for your procedure, you may not have a normal bowel movement for a few days. ? ?Please Note:  You might notice some irritation and congestion in your nose or some drainage.  This is from the oxygen used during your procedure.  There is no need for concern and it should clear up in a day or so. ? ?SYMPTOMS TO REPORT IMMEDIATELY: ? ?Following lower endoscopy (colonoscopy or flexible sigmoidoscopy): ? Excessive amounts of blood in the stool ? Significant tenderness or worsening of abdominal pains ? Swelling of the abdomen that is new, acute ? Fever of 100?F or higher ? ? ? ?For urgent or emergent issues, a gastroenterologist can be reached at any hour by calling [phone]. ?Do not use MyChart messaging for urgent concerns.  ? ? ?DIET:  We do recommend a small meal at first, but  then you may proceed to your regular diet.  Drink plenty of fluids but you should avoid alcoholic beverages for 24 hours. ? ?ACTIVITY:  You should plan to take it easy for the rest of today and you should NOT DRIVE or use heavy machinery until tomorrow (because of the sedation medicines used during the test).   ? ?FOLLOW UP: ?Our staff will call the number listed on your records 48-72 hours following your procedure to check on you and address any questions or concerns that you may have regarding the information given to you following your procedure. If we do not reach you, we will leave a message.  We will attempt to reach you two times.  During this call, we will ask if you have developed any symptoms of COVID 19. If you develop any symptoms (ie: fever, flu-like symptoms, shortness of breath, cough etc.) before then, please call [phone].  If you test positive for Covid 19 in the 2 weeks post procedure, please call and report this information to [place].   ? ?If any biopsies were taken you will be contacted by phone or by letter within the next 1-3 weeks.  Please call us at [phone] if you have not heard about the biopsies in 3 weeks.  ? ? ?SIGNATURES/CONFIDENTIALITY: ?You and/or your care partner have signed paperwork which will be entered into your electronic medical record.  These signatures attest to the fact that that the information above on your  After Visit Summary has been reviewed and is understood.  Full responsibility of the confidentiality of this discharge information lies with you and/or your care-partner.  ?

## 2022-01-03 NOTE — Progress Notes (Signed)
Pt's states no medical or surgical changes since previsit or office visit. 

## 2022-01-03 NOTE — Progress Notes (Signed)
Called to room to assist during endoscopic procedure.  Patient ID and intended procedure confirmed with present staff. Received instructions for my participation in the procedure from the performing physician.  

## 2022-01-03 NOTE — Op Note (Addendum)
[name] ?Patient Name: Jose Singh ?Procedure Date: 01/03/2022 8:40 AM ?MRN: [id number] ?Endoscopist: Sonny Masters "Christia Reading ,  ?Age: 51 ?Referring MD:  ?Date of Birth: 02-28-1971 ?Gender: Male ?Account #: [id number] ?Procedure:                Colonoscopy ?Indications:              Screening for colorectal malignant neoplasm, This  ?                          is the patient's first colonoscopy ?Medicines:                Monitored Anesthesia Care ?Procedure:                Pre-Anesthesia Assessment: ?                          - Prior to the procedure, a History and Physical  ?                          was performed, and patient medications and  ?                          allergies were reviewed. The patient's tolerance of  ?                          previous anesthesia was also reviewed. The risks  ?                          and benefits of the procedure and the sedation  ?                          options and risks were discussed with the patient.  ?                          All questions were answered, and informed consent  ?                          was obtained. Prior Anticoagulants: The patient has  ?                          taken no previous anticoagulant or antiplatelet  ?                          agents. ASA Grade Assessment: II - A patient with  ?                          mild systemic disease. After reviewing the risks  ?                          and benefits, the patient was deemed in  ?                          satisfactory condition to undergo the procedure. ?  After obtaining informed consent, the colonoscope  ?                          was passed under direct vision. Throughout the  ?                          procedure, the patient's blood pressure, pulse, and  ?                          oxygen saturations were monitored continuously. The  ?                          Olympus CF-[id number] (#[id number]) Colonoscope was  ?                          introduced through the  anus and advanced to the the  ?                          terminal ileum. The colonoscopy was performed  ?                          without difficulty. The patient tolerated the  ?                          procedure well. The quality of the bowel  ?                          preparation was excellent. The terminal ileum,  ?                          ileocecal valve, appendiceal orifice, and rectum  ?                          were photographed. ?Scope In: 8:44:27 AM ?Scope Out: 9:03:38 AM ?Scope Withdrawal Time: 0 hours 15 minutes 53 seconds  ?Total Procedure Duration: 0 hours 19 minutes 11 seconds  ?Findings:                 The terminal ileum appeared normal. ?                          Two sessile polyps were found in the sigmoid colon  ?                          and transverse colon. The polyps were 3 to 4 mm in  ?                          size. These polyps were removed with a cold snare.  ?                          Resection and retrieval were complete. ?                          Multiple diverticula were found in the sigmoid  ?  colon and descending colon. ?                          Non-bleeding internal hemorrhoids were found during  ?                          retroflexion. ?Complications:            No immediate complications. ?Estimated Blood Loss:     Estimated blood loss was minimal. ?Impression:               - The examined portion of the ileum was normal. ?                          - Two 3 to 4 mm polyps in the sigmoid colon and in  ?                          the transverse colon, removed with a cold snare.  ?                          Resected and retrieved. ?                          - Diverticulosis in the sigmoid colon and in the  ?                          descending colon. ?                          - Non-bleeding internal hemorrhoids. ?Recommendation:           - Discharge patient to home (with escort). ?                          - Await pathology results. ?                           - The findings and recommendations were discussed  ?                          with the patient. ?Georgian Co,  ?01/03/2022 9:06:34 AM ?

## 2022-01-05 ENCOUNTER — Telehealth: Payer: Self-pay

## 2022-01-05 ENCOUNTER — Encounter: Payer: Self-pay | Admitting: Internal Medicine

## 2022-01-05 NOTE — Telephone Encounter (Signed)
?  Follow up Call- ? ? ?  01/03/2022  ?  7:34 AM  ?Call back number  ?Post procedure Call Back phone  # [phone]  ?Permission to leave phone message Yes  ?  ? ?Patient questions: ? ?Do you have a fever, pain , or abdominal swelling? No. ?Pain Score  0 * ? ?Have you tolerated food without any problems? Yes.   ? ?Have you been able to return to your normal activities? Yes.   ? ?Do you have any questions about your discharge instructions: ?Diet   No. ?Medications  No. ?Follow up visit  No. ? ?Do you have questions or concerns about your Care? No. ? ?Actions: ?* If pain score is 4 or above: ?No action needed, pain <4. ? ? ?

## 2023-04-14 IMAGING — CT CT CARDIAC CORONARY ARTERY CALCIUM SCORE
3 series · 14 of 20 positions shown, 16 images · non-contrast
Comparison: None.

CLINICAL DATA: Hyperlipidemia

EXAM:
CT CARDIAC CORONARY ARTERY CALCIUM SCORE
TECHNIQUE: Non-contrast imaging through the heart was performed using
prospective ECG gating. Image post processing was performed on an
independent workstation, allowing for quantitative analysis of the
heart and coronary arteries. Note that this exam targets the heart
and the chest was not imaged in its entirety.

[Series 2: calcium scoring 2.00 qr36 bestdiast 70% hrt calciu · axial · 0.39mm/px · z∈[+1784,+1868]mm · 4 of 70 slices shown]
[im 14/70  vessel]
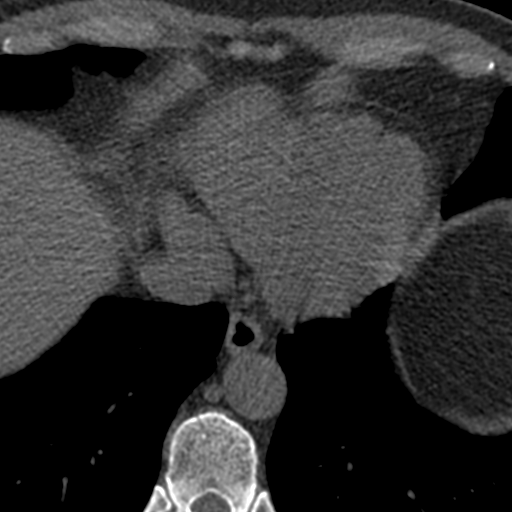
[im 28/70  vessel]
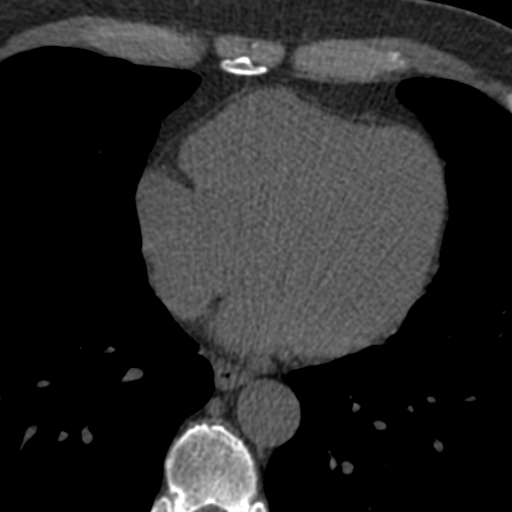
[im 42/70  vessel]
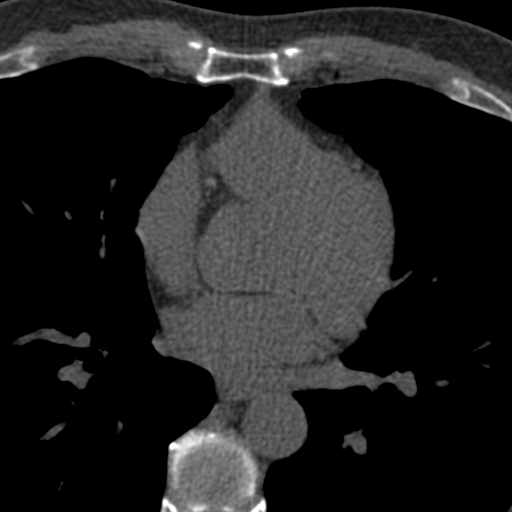
[im 56/70  vessel]
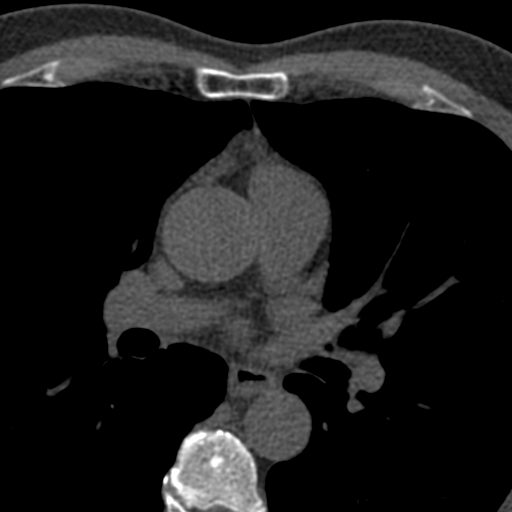

[Series 3: calcium scoring 2.00 br40 bestdiast 70% axial · axial · 0.62mm/px · z∈[+1780,+1872]mm · 5 of 70 slices shown, 7 images]
[im 12/70  vessel]
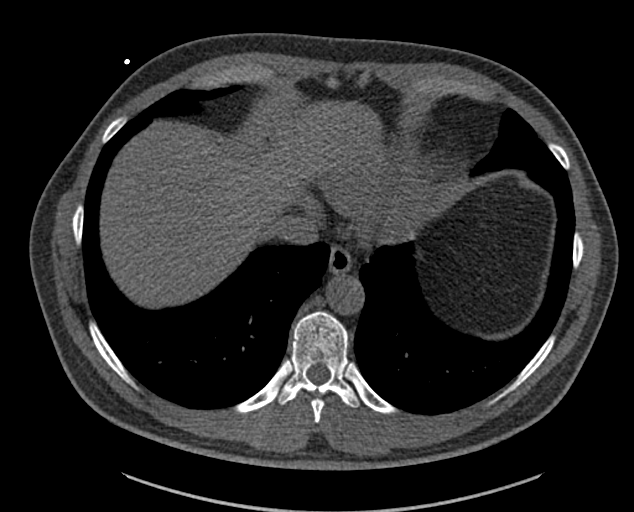
[im 12/70  lung]
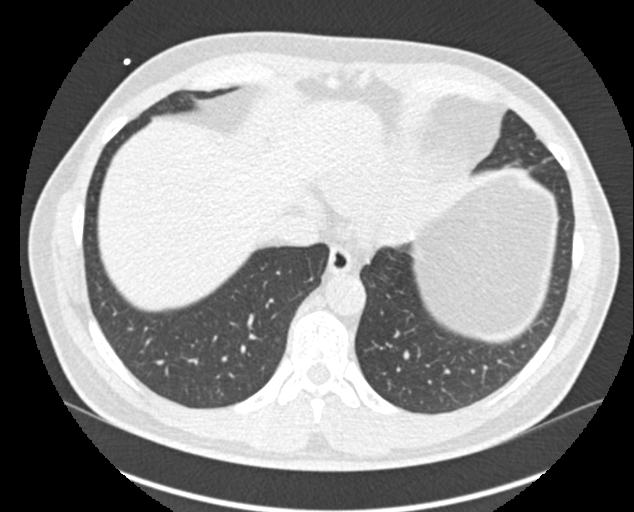
[im 24/70  vessel]
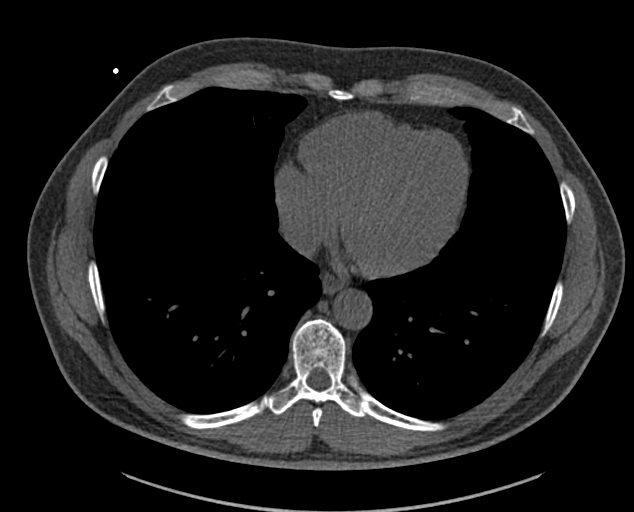
[im 35/70  vessel]
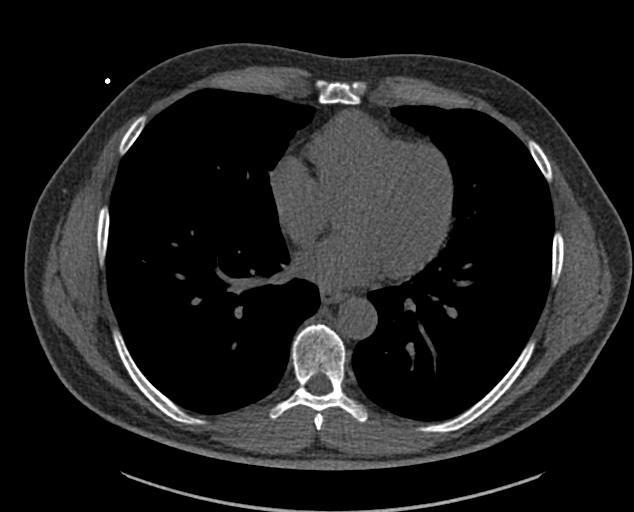
[im 47/70  vessel]
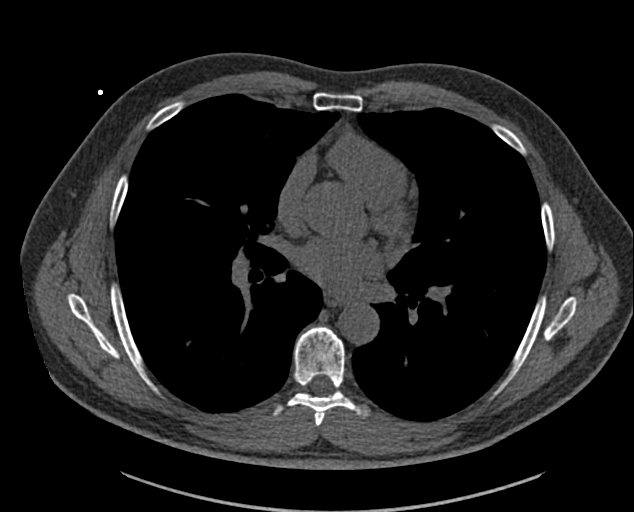
[im 58/70  vessel]
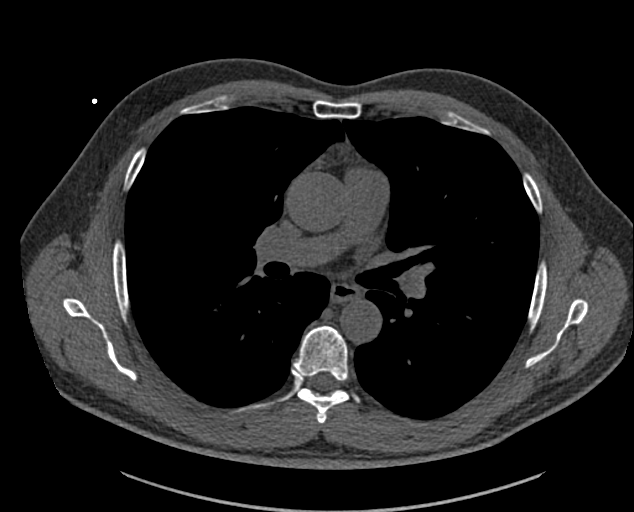
[im 58/70  lung]
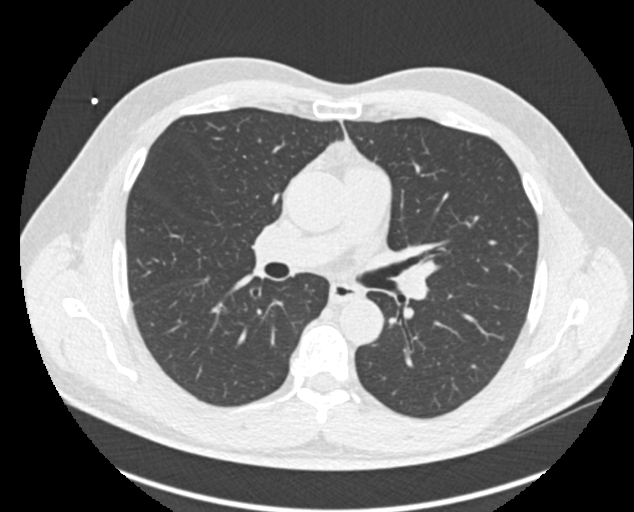

[Series 9: calcium scoring 2.00 br60 bestdiast 70% lungs · axial · 0.62mm/px · z∈[+1780,+1872]mm · 5 of 70 slices shown]
[im 12/70  vessel]
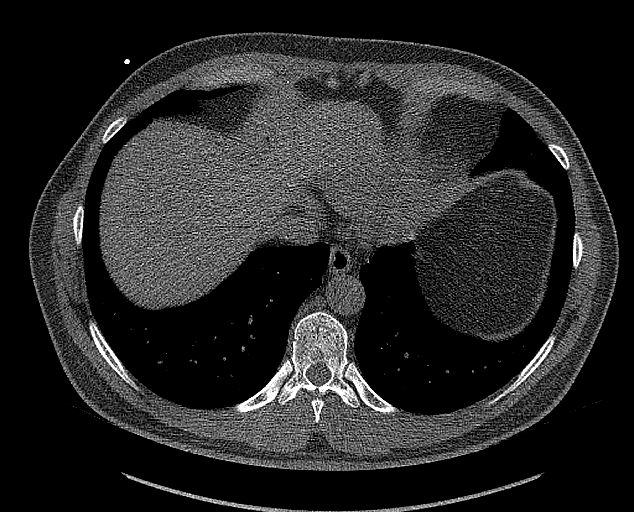
[im 24/70  vessel]
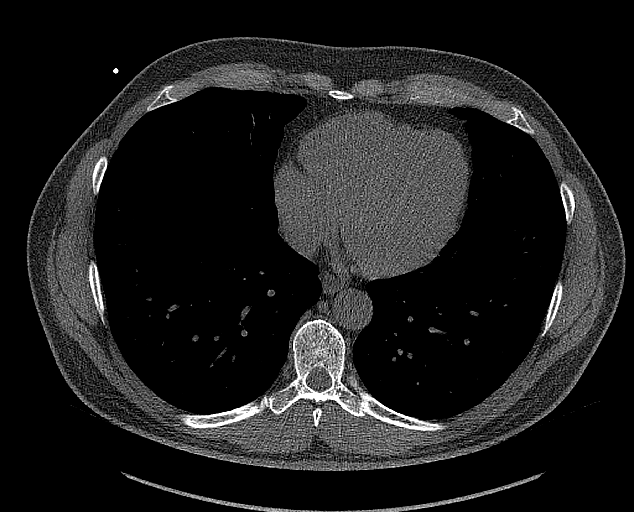
[im 35/70  vessel]
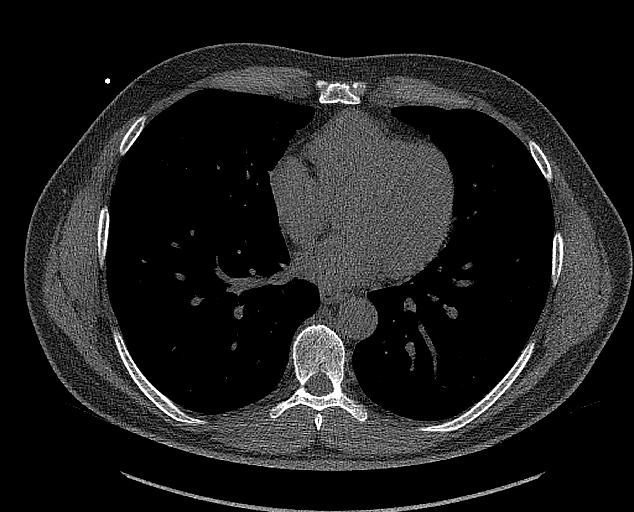
[im 47/70  vessel]
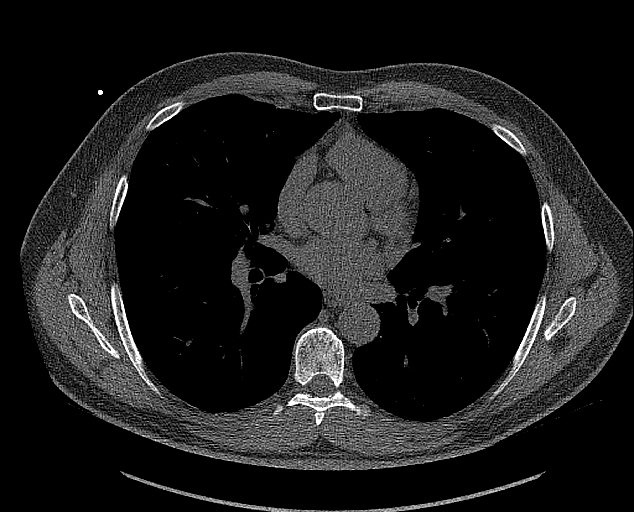
[im 58/70  vessel]
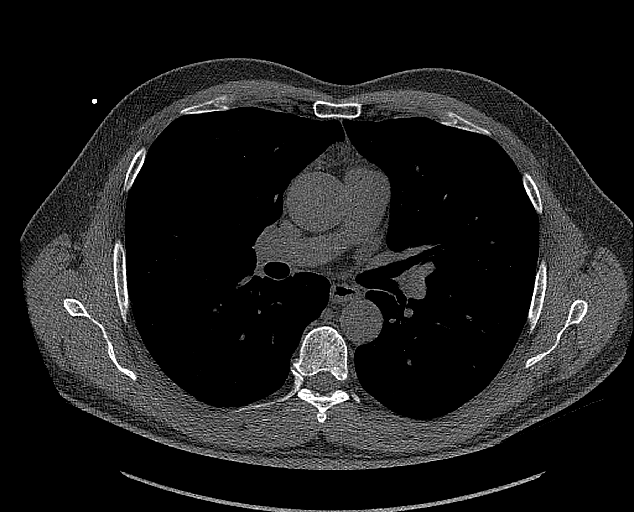

[14 of 20 positions shown; findings below may reference images not displayed]

FINDINGS: CORONARY CALCIUM SCORES:

Left Main: 0

LAD: 0

LCx: 0

RCA: 0

Total Agatston Score: 0

[HOSPITAL] percentile: 0

AORTA MEASUREMENTS:

Ascending Aorta: 37 mm

Descending Aorta: 25 mm

OTHER FINDINGS:

Heart is normal size. Aorta normal caliber. Punctate calcification
in the aortic root. No adenopathy. No confluent opacities or
effusions. Scarring in the lung bases. Imaging into the upper
abdomen demonstrates no acute findings. Chest wall soft tissues are
unremarkable. No acute bony abnormality.
IMPRESSION: No visible coronary artery calcifications. Total coronary calcium
score of 0.

No acute or significant extracardiac abnormality.

## 2023-05-01 ENCOUNTER — Ambulatory Visit: Payer: Self-pay | Admitting: Cardiology

## 2023-08-23 ENCOUNTER — Ambulatory Visit (HOSPITAL_COMMUNITY): Payer: BC Managed Care – PPO | Admitting: Physician Assistant

## 2023-08-24 ENCOUNTER — Ambulatory Visit (HOSPITAL_COMMUNITY)
Admission: RE | Admit: 2023-08-24 | Discharge: 2023-08-24 | Disposition: A | Discharge status: Home / Self Care | Payer: BC Managed Care – PPO | Source: Ambulatory Visit | Attending: Physician Assistant | Admitting: Physician Assistant

## 2023-08-24 VITALS — BP 112/68 | HR 65 | Ht 66.0 in | Wt 188.8 lb

## 2023-08-24 DIAGNOSIS — G4733 Obstructive sleep apnea (adult) (pediatric): Secondary | ICD-10-CM | POA: Insufficient documentation

## 2023-08-24 DIAGNOSIS — I4891 Unspecified atrial fibrillation: Secondary | ICD-10-CM | POA: Diagnosis present

## 2023-08-24 DIAGNOSIS — I10 Essential (primary) hypertension: Secondary | ICD-10-CM | POA: Diagnosis present

## 2023-08-24 DIAGNOSIS — I480 Paroxysmal atrial fibrillation: Secondary | ICD-10-CM | POA: Insufficient documentation

## 2023-08-24 NOTE — Progress Notes (Signed)
Primary Care Physician: Rodrigo Ran, MD Primary Cardiologist: None Electrophysiologist: None  Referring Physician: Dr Tiana Loft is a 52 y.o. male with a history of HTN, OSA, atrial fibrillation who presents for consultation in the [facility].  The patient was initially diagnosed with atrial fibrillation on his smart watch. He has had frequent PACs noted previously but had a true afib reading on 07/22/23. Patient reports the episode did not last much longer than one minute. Patient has a CHADS2VASC score of 1. He is very active and an avid runner.   Today, patient reports that he feels well. He is in SR. His palpitations can be several weeks in between. He did have a sleep study remotely but has not used a CPAP for a few years.   Today, he denies symptoms of chest pain, shortness of breath, orthopnea, PND, lower extremity edema, dizziness, presyncope, syncope, bleeding, or neurologic sequela. The patient is tolerating medications without difficulties and is otherwise without complaint today.    Atrial Fibrillation Risk Factors:  he does have symptoms or diagnosis of sleep apnea. he does not have a history of rheumatic fever. he does not have a history of alcohol use. The patient does not have a history of early familial atrial fibrillation or other arrhythmias.  Atrial Fibrillation Management history:  Previous antiarrhythmic drugs: none Previous cardioversions: none Previous ablations: none Anticoagulation history: none  ROS- All systems are reviewed and negative except as per the HPI above.  Past Medical History:  Diagnosis Date   Allergic rhinitis    Hypersomnia    mslt 2004 w/ no SOREMS, MSL 17 min   Hypertension    OSA (obstructive sleep apnea)    AHI 5/hr w/ UAR noted 2004    Current Outpatient Medications  Medication Sig Dispense Refill   lisinopril (ZESTRIL) 10 MG tablet Take 10 mg by mouth daily.      methylphenidate 36 MG PO CR tablet 1 tablet in the morning     Multiple Vitamin (MULTIVITAMIN) capsule Take 1 capsule by mouth daily.       Omega-3 Fatty Acids (OMEGA 3 PO) Take 2 tablets by mouth daily.     No current facility-administered medications for this encounter.    Physical Exam: BP 112/68   Pulse 65   Ht 5\' 6"  (1.676 m)   Wt 85.6 kg   BMI 30.47 kg/m   GEN: Well nourished, well developed in no acute distress NECK: No JVD; No carotid bruits CARDIAC: Regular rate and rhythm, no murmurs, rubs, gallops RESPIRATORY:  Clear to auscultation without rales, wheezing or rhonchi  ABDOMEN: Soft, non-tender, non-distended EXTREMITIES:  No edema; No deformity   Wt Readings from Last 3 Encounters:  08/24/23 85.6 kg  01/03/22 80.7 kg  12/20/21 80.7 kg     EKG today demonstrates  SR Vent. rate 65 BPM PR interval 200 ms QRS duration 86 ms QT/QTcB 378/393 ms      CHA2DS2-VASc Score = 1  The patient's score is based upon: CHF History: 0 HTN History: 1 Diabetes History: 0 Stroke History: 0 Vascular Disease History: 0 Age Score: 0 Gender Score: 0       ASSESSMENT AND PLAN: Paroxysmal Atrial Fibrillation (ICD10:  I48.0) The patient's CHA2DS2-VASc score is 1, indicating a 0.6% annual risk of stroke.   General education about afib provided and questions answered. We also discussed his stroke risk and the risks and benefits of anticoagulation. Check echocardiogram Continue to  monitor at home with smart watch Anticoagulation not indicated at this time with a low CV score.   HTN Stable on current regimen  OSA  Mild on remote sleep study. Previously followed by Dr Shelle Iron.  The importance of adequate treatment of sleep apnea was discussed today in order to improve our ability to maintain sinus rhythm long term. He is open to a repeat sleep study to see if he need CPAP, will refer.     Follow up in the AF clinic in 3 months.        Jorja Loa PA-C Afib  Clinic [facility] [address] [place], [place] [address] [phone]

## 2023-09-28 ENCOUNTER — Ambulatory Visit (HOSPITAL_COMMUNITY): Payer: BC Managed Care – PPO

## 2023-10-13 ENCOUNTER — Ambulatory Visit (HOSPITAL_COMMUNITY): Payer: BC Managed Care – PPO

## 2023-11-23 ENCOUNTER — Ambulatory Visit (HOSPITAL_COMMUNITY): Payer: BC Managed Care – PPO | Admitting: Physician Assistant
